# Patient Record
Sex: Male | Born: 1950 | Race: White | Hispanic: No | Marital: Single
Health system: Southern US, Community
[De-identification: ages and names within clinical notes are randomized; demographics above are authoritative.]

## PROBLEM LIST (undated history)

## (undated) DIAGNOSIS — R200 Anesthesia of skin: Secondary | ICD-10-CM

## (undated) DIAGNOSIS — T8859XA Other complications of anesthesia, initial encounter: Secondary | ICD-10-CM

---

## 2021-02-10 ENCOUNTER — Other Ambulatory Visit: Payer: Self-pay | Admitting: Urology

## 2021-02-10 DIAGNOSIS — R972 Elevated prostate specific antigen [PSA]: Secondary | ICD-10-CM

## 2021-02-25 ENCOUNTER — Ambulatory Visit
Admission: RE | Admit: 2021-02-25 | Discharge: 2021-02-25 | Disposition: A | Discharge status: Home / Self Care | Payer: Self-pay | Source: Ambulatory Visit | Attending: Urology | Admitting: Urology

## 2021-02-25 DIAGNOSIS — R972 Elevated prostate specific antigen [PSA]: Secondary | ICD-10-CM

## 2021-02-25 MED ORDER — GADOBENATE DIMEGLUMINE 529 MG/ML IV SOLN
20.0000 mL | Freq: Once | INTRAVENOUS | Status: AC | PRN
  Administered 2021-02-25: 20 mL via INTRAVENOUS

## 2021-03-02 ENCOUNTER — Other Ambulatory Visit: Payer: Self-pay | Admitting: Urology

## 2021-03-02 DIAGNOSIS — D483 Neoplasm of uncertain behavior of retroperitoneum: Secondary | ICD-10-CM

## 2021-03-27 ENCOUNTER — Ambulatory Visit
Admission: RE | Admit: 2021-03-27 | Discharge: 2021-03-27 | Disposition: A | Discharge status: Home / Self Care | Payer: Medicare Other | Source: Ambulatory Visit | Attending: Urology | Admitting: Urology

## 2021-03-27 DIAGNOSIS — D483 Neoplasm of uncertain behavior of retroperitoneum: Secondary | ICD-10-CM

## 2021-03-27 MED ORDER — GADOBENATE DIMEGLUMINE 529 MG/ML IV SOLN
20.0000 mL | Freq: Once | INTRAVENOUS | Status: AC | PRN
  Administered 2021-03-27: 20 mL via INTRAVENOUS

## 2021-11-03 ENCOUNTER — Other Ambulatory Visit: Payer: Self-pay | Admitting: Urology

## 2021-11-03 ENCOUNTER — Other Ambulatory Visit (HOSPITAL_COMMUNITY): Payer: Self-pay | Admitting: Urology

## 2021-11-03 DIAGNOSIS — R1903 Right lower quadrant abdominal swelling, mass and lump: Secondary | ICD-10-CM

## 2021-11-04 ENCOUNTER — Encounter: Payer: Self-pay | Admitting: *Deleted

## 2021-11-04 NOTE — Progress Notes (Unsigned)
Javier Edouard, MD  [name] OK for CT guided bx of nodule abutting right psoas muscle by CT.   GY

## 2021-11-30 ENCOUNTER — Other Ambulatory Visit: Payer: Self-pay | Admitting: Radiology

## 2021-11-30 DIAGNOSIS — R1900 Intra-abdominal and pelvic swelling, mass and lump, unspecified site: Secondary | ICD-10-CM

## 2021-12-01 ENCOUNTER — Ambulatory Visit (HOSPITAL_COMMUNITY)
Admission: RE | Admit: 2021-12-01 | Discharge: 2021-12-01 | Disposition: A | Discharge status: Home / Self Care | Payer: Medicare Other | Source: Ambulatory Visit | Attending: Urology | Admitting: Urology

## 2021-12-01 ENCOUNTER — Other Ambulatory Visit: Payer: Self-pay

## 2021-12-01 ENCOUNTER — Encounter (HOSPITAL_COMMUNITY): Payer: Self-pay

## 2021-12-01 DIAGNOSIS — C779 Secondary and unspecified malignant neoplasm of lymph node, unspecified: Secondary | ICD-10-CM | POA: Insufficient documentation

## 2021-12-01 DIAGNOSIS — K760 Fatty (change of) liver, not elsewhere classified: Secondary | ICD-10-CM | POA: Diagnosis not present

## 2021-12-01 DIAGNOSIS — C61 Malignant neoplasm of prostate: Secondary | ICD-10-CM | POA: Diagnosis not present

## 2021-12-01 DIAGNOSIS — R1903 Right lower quadrant abdominal swelling, mass and lump: Secondary | ICD-10-CM

## 2021-12-01 DIAGNOSIS — N400 Enlarged prostate without lower urinary tract symptoms: Secondary | ICD-10-CM | POA: Insufficient documentation

## 2021-12-01 DIAGNOSIS — Z8546 Personal history of malignant neoplasm of prostate: Secondary | ICD-10-CM | POA: Insufficient documentation

## 2021-12-01 DIAGNOSIS — R1900 Intra-abdominal and pelvic swelling, mass and lump, unspecified site: Secondary | ICD-10-CM

## 2021-12-01 HISTORY — DX: Other complications of anesthesia, initial encounter: T88.59XA

## 2021-12-01 HISTORY — DX: Anesthesia of skin: R20.0

## 2021-12-01 LAB — CBC WITH DIFFERENTIAL/PLATELET
Abs Immature Granulocytes: 0.04 10*3/uL (ref 0.00–0.07)
Basophils Absolute: 0.1 10*3/uL (ref 0.0–0.1)
Basophils Relative: 1 %
Eosinophils Absolute: 0.1 10*3/uL (ref 0.0–0.5)
Eosinophils Relative: 1 %
HCT: 49.7 % (ref 39.0–52.0)
Hemoglobin: 16.7 g/dL (ref 13.0–17.0)
Immature Granulocytes: 1 %
Lymphocytes Relative: 44 %
Lymphs Abs: 3.8 10*3/uL (ref 0.7–4.0)
MCH: 30.3 pg (ref 26.0–34.0)
MCHC: 33.6 g/dL (ref 30.0–36.0)
MCV: 90.2 fL (ref 80.0–100.0)
Monocytes Absolute: 0.8 10*3/uL (ref 0.1–1.0)
Monocytes Relative: 9 %
Neutro Abs: 3.8 10*3/uL (ref 1.7–7.7)
Neutrophils Relative %: 44 %
Platelets: 230 10*3/uL (ref 150–400)
RBC: 5.51 MIL/uL (ref 4.22–5.81)
RDW: 13 % (ref 11.5–15.5)
WBC: 8.6 10*3/uL (ref 4.0–10.5)
nRBC: 0 % (ref 0.0–0.2)

## 2021-12-01 LAB — BASIC METABOLIC PANEL
Anion gap: 8 (ref 5–15)
BUN: 15 mg/dL (ref 8–23)
CO2: 23 mmol/L (ref 22–32)
Calcium: 8.9 mg/dL (ref 8.9–10.3)
Chloride: 108 mmol/L (ref 98–111)
Creatinine, Ser: 1.02 mg/dL (ref 0.61–1.24)
GFR, Estimated: >60 mL/min (ref >60)
Glucose, Bld: 117 mg/dL — ABNORMAL HIGH (ref 70–99)
Potassium: 4.1 mmol/L (ref 3.5–5.1)
Sodium: 139 mmol/L (ref 135–145)

## 2021-12-01 LAB — PROTIME-INR
INR: 1.1 (ref 0.8–1.2)
Prothrombin Time: 13.7 seconds (ref 11.4–15.2)

## 2021-12-01 MED ORDER — FENTANYL CITRATE (PF) 100 MCG/2ML IJ SOLN
INTRAMUSCULAR | Status: AC | PRN
  Administered 2021-12-01 (×2): 50 ug via INTRAVENOUS

## 2021-12-01 MED ORDER — MIDAZOLAM HCL 2 MG/2ML IJ SOLN
INTRAMUSCULAR | Status: AC | PRN
  Administered 2021-12-01 (×2): 1 mg via INTRAVENOUS

## 2021-12-01 MED ORDER — MIDAZOLAM HCL 2 MG/2ML IJ SOLN
INTRAMUSCULAR | Status: AC
  Filled 2021-12-01: qty 4

## 2021-12-01 MED ORDER — FENTANYL CITRATE (PF) 100 MCG/2ML IJ SOLN
INTRAMUSCULAR | Status: AC
  Filled 2021-12-01: qty 2

## 2021-12-01 MED ORDER — SODIUM CHLORIDE 0.9 % IV SOLN: INTRAVENOUS | Status: DC

## 2021-12-01 NOTE — Procedures (Signed)
Interventional Radiology Procedure Note  Procedure: CT bx of right retroperitoneal soft tissue mass  Complications: None  Estimated Blood Loss: None  Recommendations: - DC home   Signed,  Criselda Peaches, MD

## 2021-12-01 NOTE — Discharge Instructions (Signed)
Please call Interventional Radiology clinic [phone] with any questions or concerns.   You may remove your dressing and shower tomorrow.    Needle Biopsy, Care After These instructions tell you how to care for yourself after your procedure. Your doctor may also give you more specific instructions. Call your doctor if youhave any problems or questions. What can I expect after the procedure? After the procedure, it is common to have: Soreness. Bruising. Mild pain. Follow these instructions at home:  Return to your normal activities as told by your doctor. Ask your doctor what activities are safe for you. Take over-the-counter and prescription medicines only as told by your doctor. Wash your hands with soap and water before you change your bandage (dressing). If you cannot use soap and water, use hand sanitizer. Follow instructions from your doctor about: How to take care of your puncture site. When and how to change your bandage. When to remove your bandage. Check your puncture site every day for signs of infection. Watch for: Redness, swelling, or pain. Fluid or blood. Pus or a bad smell. Warmth. Do not take baths, swim, or use a hot tub until your doctor approves. Ask your doctor if you may take showers. You may only be allowed to take sponge baths. Keep all follow-up visits as told by your doctor. This is important. Contact a doctor if you have: A fever. Redness, swelling, or pain at the puncture site, and it lasts longer than a few days. Fluid, blood, or pus coming from the puncture site. Warmth coming from the puncture site. Get help right away if: You have a lot of bleeding from the puncture site. Summary After the procedure, it is common to have soreness, bruising, or mild pain at the puncture site. Check your puncture site every day for signs of infection, such as redness, swelling, or pain. Get help right away if you have severe bleeding from your puncture  site. This information is not intended to replace advice given to you by your health care provider. Make sure you discuss any questions you have with your healthcare provider. Document Revised: 10/09/2019 Document Reviewed: 10/09/2019 Elsevier Patient Education  2022 Else[employer] Inc[employer]   Moderate Conscious Sedation, Adult, Care After This sheet gives you information about how to care for yourself after your procedure. Your health care provider may also give you more specific instructions. If you have problems or questions, contact your health care provider. What can I expect after the procedure? After the procedure, it is common to have: Sleepiness for several hours. Impaired judgment for several hours. Difficulty with balance. Vomiting if you eat too soon. Follow these instructions at home: For the time period you were told by your health care provider: Rest. Do not participate in activities where you could fall or become injured. Do not drive or use machinery. Do not drink alcohol. Do not take sleeping pills or medicines that cause drowsiness. Do not make important decisions or sign legal documents. Do not take care of children on your own.      Eating and drinking Follow the diet recommended by your health care provider. Drink enough fluid to keep your urine pale yellow. If you vomit: Drink water, juice, or soup when you can drink without vomiting. Make sure you have little or no nausea before eating solid foods.   General instructions Take over-the-counter and prescription medicines only as told by your health care provider. Have a responsible adult stay with you for the time you are told.   It is important to have someone help care for you until you are awake and alert. Do not smoke. Keep all follow-up visits as told by your health care provider. This is important. Contact a health care provider if: You are still sleepy or having trouble with balance after 24 hours. You feel  light-headed. You keep feeling nauseous or you keep vomiting. You develop a rash. You have a fever. You have redness or swelling around the IV site. Get help right away if: You have trouble breathing. You have new-onset confusion at home. Summary After the procedure, it is common to feel sleepy, have impaired judgment, or feel nauseous if you eat too soon. Rest after you get home. Know the things you should not do after the procedure. Follow the diet recommended by your health care provider and drink enough fluid to keep your urine pale yellow. Get help right away if you have trouble breathing or new-onset confusion at home. This information is not intended to replace advice given to you by your health care provider. Make sure you discuss any questions you have with your health care provider. Document Revised: 08/08/2019 Document Reviewed: 03/06/2019 Elsevier Patient Education  2021 Elsevier Inc.     

## 2021-12-01 NOTE — Consult Note (Signed)
Chief Complaint: Patient was seen in consultation today for image guided biopsy of nodule abutting right psoas muscle  Referring Physician(s): [facility]  Supervising Physician: Jacqulynn Cadet  Patient Status: [facility] - Out-pt  History of Present Illness: Daryon Remmert is a 71 y.o. male with past medical history of BPH/LUTS and elevated PSA.  Patient has a past history of a right retroperitoneal lesion near the right psoas muscle with latest MRI on 03/28/2021 revealing:   1. There is a redemonstrated intrinsically T2 hyperintense, T1 hypointense lesion in the right iliopsoas groove measuring 2.2 x 1.8 cm demonstrating serpiginous peripheral contrast enhancement. This remains of uncertain nature, and as discussed on prior examination, general differential considerations include a necrotic lymph node, peripheral nerve tumor, or lymphovascular structure. Metastatic lymphadenopathy or soft tissue metastasis is a general differential consideration, but this would be an unusual, isolated manifestation of metastatic prostate malignancy, particularly in the absence of other evidence of lymphadenopathy or metastatic disease in the abdomen or pelvis. Recommend follow-up surveillance imaging in 3-6 months to ensure stability. This could further be characterized for metabolic activity by PET-CT and is likely amenable to percutaneous tissue sampling given location. 2. Severe, nodular prostatomegaly, better characterized by prior dedicated MR of the prostate. 3. No acute findings in the abdomen or pelvis  CT at urology office in April of this year revealed:   1. Enhancing thick-walled nodule adjacent to the right psoas muscle, with slight increase in size from 03/27/2021, findings worrisome for malignancy. 2. Marked prostate enlargement. 3. Mild basilar subpleural pulmonary parenchymal ground-glass may be indicative of interstitial lung disease. If further evaluation is desired,  high resolution chest CT without contrast is recommended. 4. Hepatic steatosis.   He presents today for image guided biopsy of the nodule abutting the right psoas muscle.          Allergies: Patient has no allergy information on record.  Medications: Prior to Admission medications   Not on File     No family history on file.  Social History   Socioeconomic History   Marital status: Single    Spouse name: Not on file   Number of children: Not on file   Years of education: Not on file   Highest education level: Not on file  Occupational History   Not on file  Tobacco Use   Smoking status: Not on file   Smokeless tobacco: Not on file  Substance and Sexual Activity   Alcohol use: Not on file   Drug use: Not on file   Sexual activity: Not on file  Other Topics Concern   Not on file  Social History Narrative   Not on file   Social Determinants of Health   Financial Resource Strain: Not on file  Food Insecurity: Not on file  Transportation Needs: Not on file  Physical Activity: Not on file  Stress: Not on file  Social Connections: Not on file      Review of Systems he currently denies fever,  chest pain, dyspnea, cough, abdominal pain, back pain, nausea, vomiting or bleeding.  He does have a mild headache.  Vital Signs: Blood pressure 151/99, temp 98.6, heart rate 78, respiration 18, O2 sat 97% room air       Physical Exam awake, alert.  Chest clear to auscultation bilaterally.  Heart with regular rate and rhythm.  Abdomen soft, positive bowel sounds, nontender.  No lower extremity edema       Assessment and Plan: 71 y.o. male with past  medical history of BPH/LUTS and elevated PSA.  Patient has a past history of a right retroperitoneal lesion near the right psoas muscle with latest MRI on 03/28/2021 revealing:   1. There is a redemonstrated intrinsically T2 hyperintense, T1 hypointense lesion in the right iliopsoas groove measuring 2.2 x 1.8 cm  demonstrating serpiginous peripheral contrast enhancement. This remains of uncertain nature, and as discussed on prior examination, general differential considerations include a necrotic lymph node, peripheral nerve tumor, or lymphovascular structure. Metastatic lymphadenopathy or soft tissue metastasis is a general differential consideration, but this would be an unusual, isolated manifestation of metastatic prostate malignancy, particularly in the absence of other evidence of lymphadenopathy or metastatic disease in the abdomen or pelvis. Recommend follow-up surveillance imaging in 3-6 months to ensure stability. This could further be characterized for metabolic activity by PET-CT and is likely amenable to percutaneous tissue sampling given location. 2. Severe, nodular prostatomegaly, better characterized by prior dedicated MR of the prostate. 3. No acute findings in the abdomen or pelvis  He presents today for image guided biopsy of the nodule abutting the right psoas muscle.  LABS PENDING   Thank you for this interesting consult.  I greatly enjoyed meeting Saatvik Hopes and look forward to participating in their care.  A copy of this report was sent to the requesting provider on this date.  Electronically Signed: D. Rowe Robert, PA-C 12/01/2021, 9:07 AM   I spent a total of  20 minutes   in face to face in clinical consultation, greater than 50% of which was counseling/coordinating care for image guided biopsy of nodule abutting right psoas muscle

## 2021-12-05 LAB — SURGICAL PATHOLOGY

## 2022-06-26 IMAGING — MR MR ABDOMEN WO/W CM
8 of 15 series · 19 of 48 positions shown · IV contrast (multihance)
Comparison: MR prostate, 02/25/2021

CLINICAL DATA: Characterize right retroperitoneal lesion identified
by prior MR prostate

EXAM:
MRI ABDOMEN AND PELVIS WITHOUT AND WITH CONTRAST
TECHNIQUE: Multiplanar multisequence MR imaging of the abdomen and pelvis was
performed both before and after the administration of intravenous
contrast.
CONTRAST:  20mL MULTIHANCE GADOBENATE DIMEGLUMINE 529 MG/ML IV SOLN

[Series 3: T2 · coronal · 6.5mm · 1.64mm/px · 1 of 31 slices shown (1 of 2)]
[im 1/31]
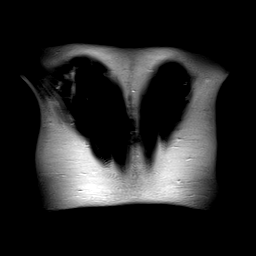

[Series 4: axial tru fisp=abdomen · axial · 4.5mm · 1.76mm/px · 1 of 44 slices shown]
[im 1/44]
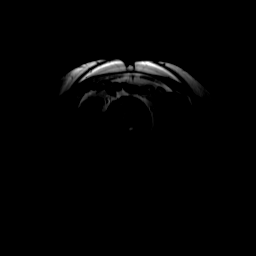

[Series 5: axial in out · axial · 6.0mm · 0.88mm/px · z∈[-1,+244]mm · 2 of 72 slices shown]
[im 1/72]
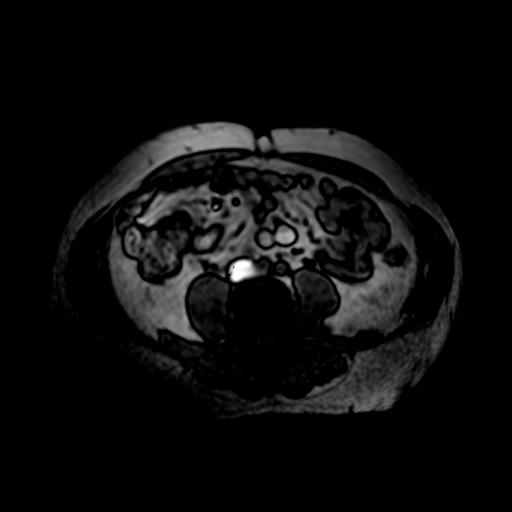
[im 72/72]
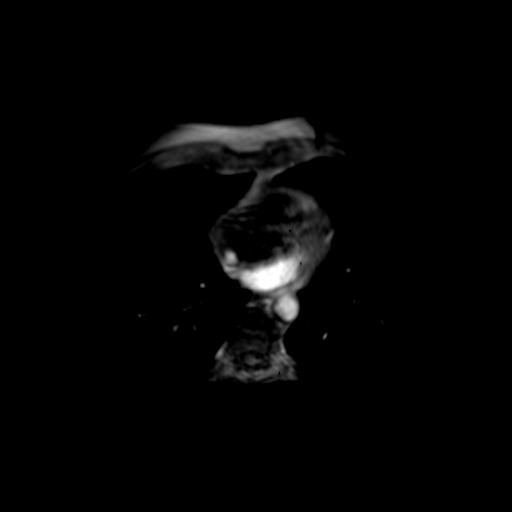

[Series 6: T2 · axial · 4.5mm · 0.88mm/px · 1 of 44 slices shown (2 of 2)]
[im 1/44]
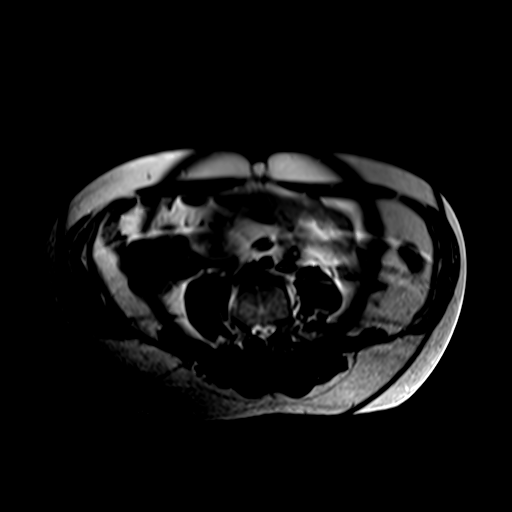

[Series 7: T1 dynamic · axial · non-contrast · 3.0mm · 0.88mm/px · z∈[+3,+240]mm · 3 of 80 slices shown]
[im 1/80]
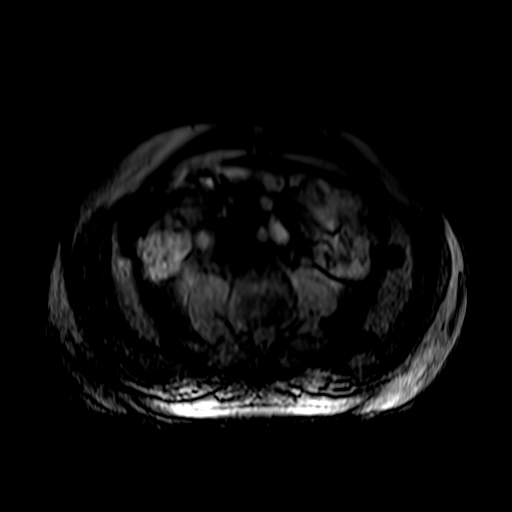
[im 40/80]
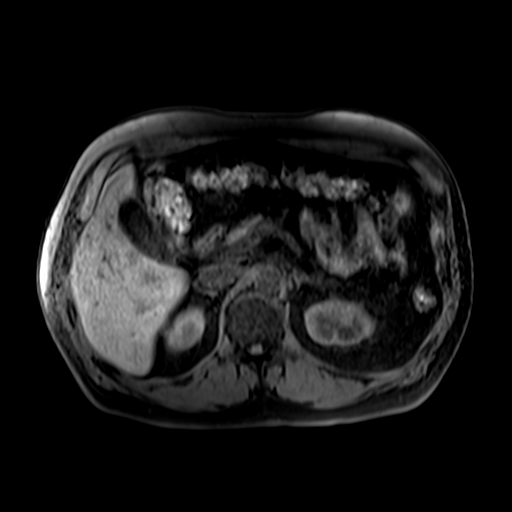
[im 80/80]
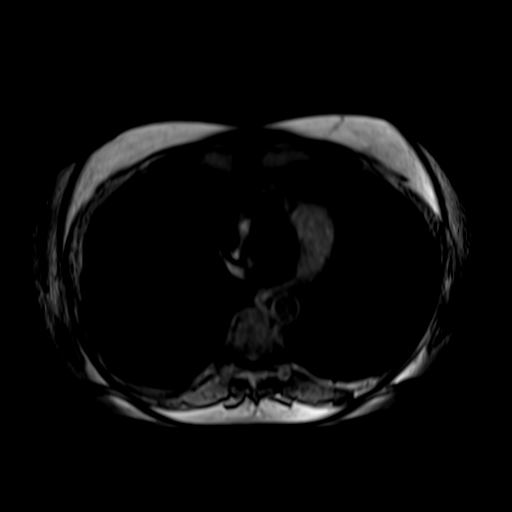

[Series 8: post 20 sec=abdomen · axial · 3.0mm · 0.88mm/px · z∈[+3,+240]mm · 4 of 80 slices shown]
[im 1/80]
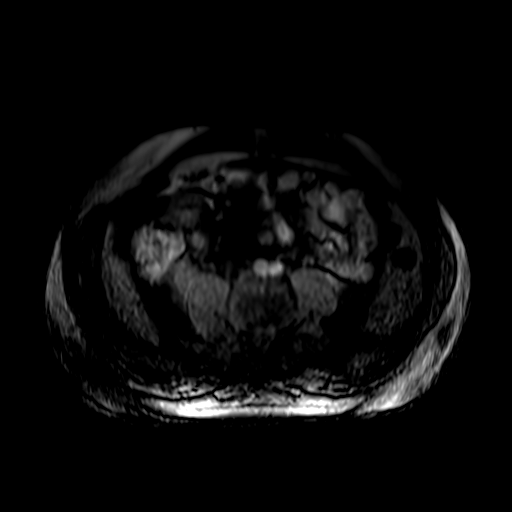
[im 27/80]
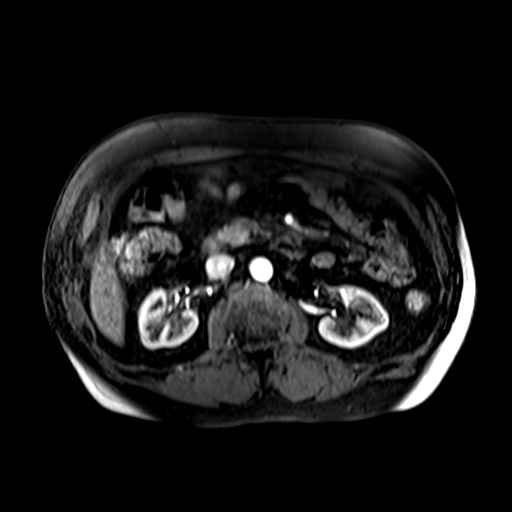
[im 53/80]
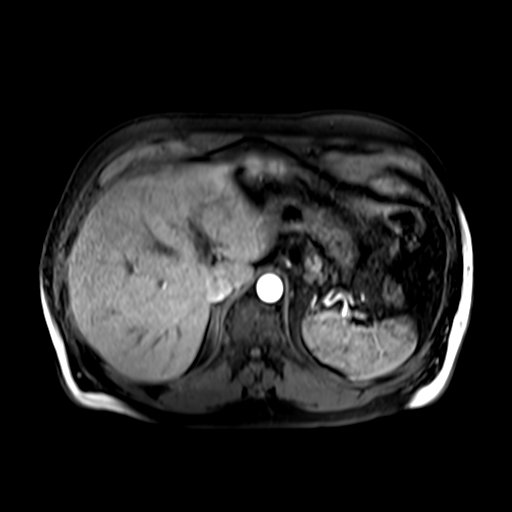
[im 80/80]
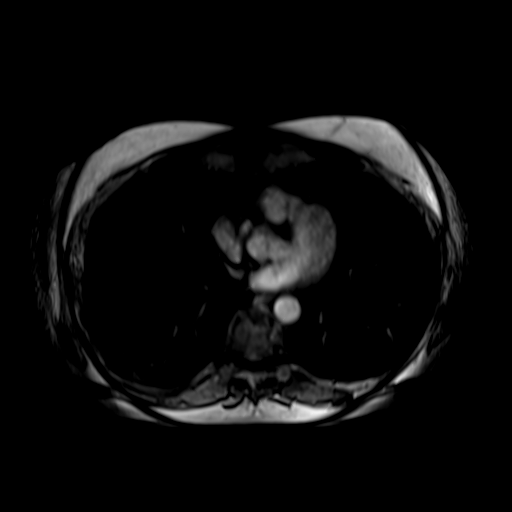

[Series 9: post 20 sec=abdomen_sub · axial · 3.0mm · 0.88mm/px · z∈[+3,+240]mm · 4 of 80 slices shown]
[im 1/80]
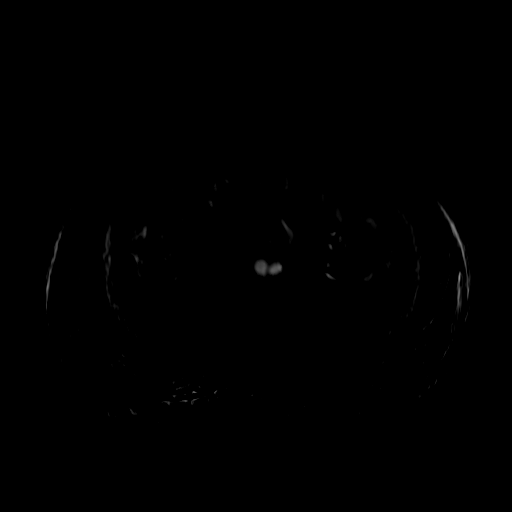
[im 27/80]
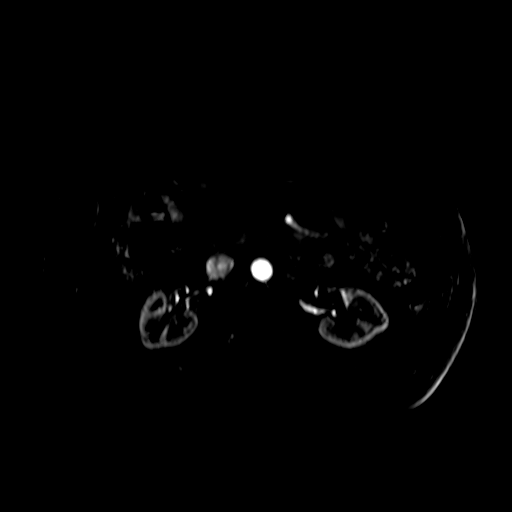
[im 53/80]
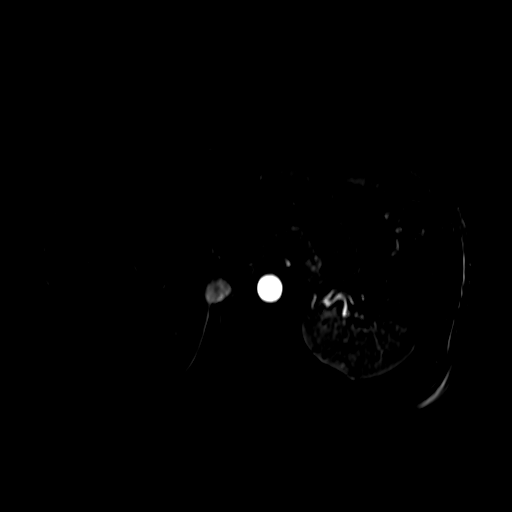
[im 80/80]
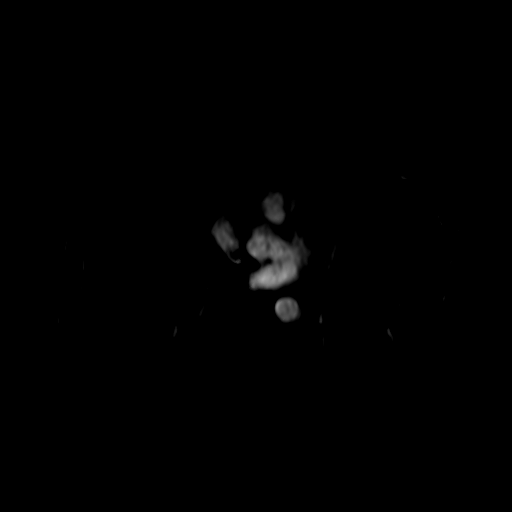

[Series 10: post 45 sec=abdomen · axial · 3.0mm · 0.88mm/px · z∈[+3,+159]mm · 3 of 80 slices shown]
[im 1/80]
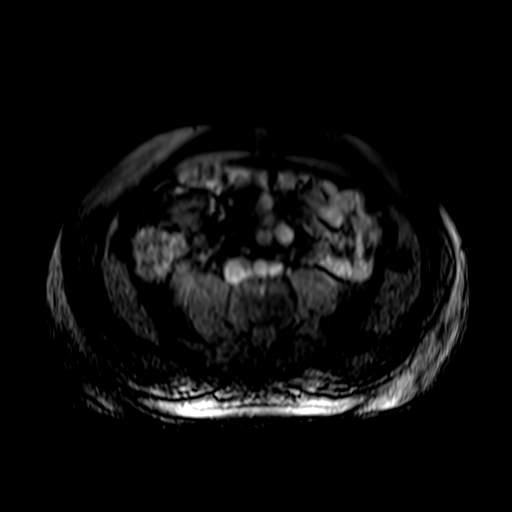
[im 27/80]
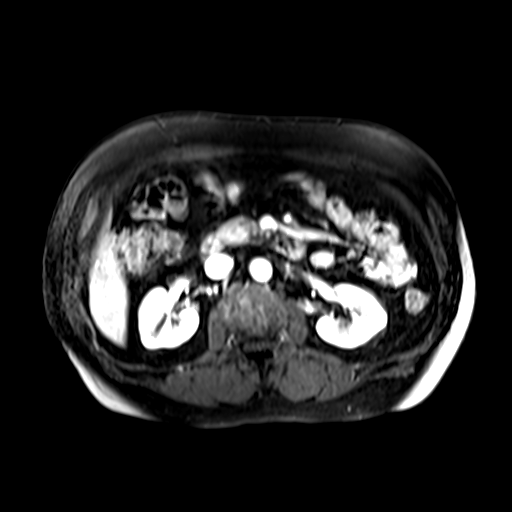
[im 53/80]
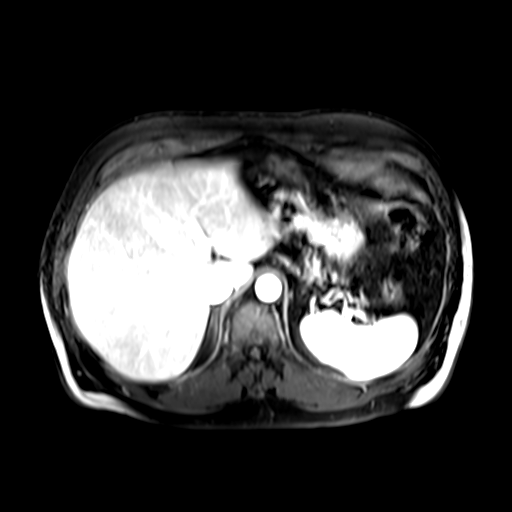

[19 of 48 positions shown; findings below may reference images not displayed]

FINDINGS: COMBINED FINDINGS FOR BOTH MR ABDOMEN AND PELVIS

Lower chest: No acute findings.

Hepatobiliary: No mass or other parenchymal abnormality identified.
No gallstones. No biliary ductal dilatation.

Pancreas: No mass, inflammatory changes, or other parenchymal
abnormality identified. No pancreatic ductal dilatation.

Spleen:  Within normal limits in size and appearance.

Adrenals/Urinary Tract: No masses identified. No evidence of
hydronephrosis.

Stomach/Bowel: Visualized portions within the abdomen are
unremarkable.

Vascular/Lymphatic: No definite pathologically enlarged lymph nodes
identified. No abdominal aortic aneurysm demonstrated.

Reproductive: Severe, nodular prostatomegaly, better characterized
by prior dedicated MR of the prostate.

Other: There is a redemonstrated intrinsically T2 hyperintense, T1
hypointense lesion in the right iliopsoas groove measuring 2.2 x
cm demonstrating serpiginous peripheral contrast enhancement (series
9, image 8).

Musculoskeletal: No suspicious bone lesions identified.
IMPRESSION: 1. There is a redemonstrated intrinsically T2 hyperintense, T1
hypointense lesion in the right iliopsoas groove measuring 2.2 x
cm demonstrating serpiginous peripheral contrast enhancement. This
remains of uncertain nature, and as discussed on prior examination,
general differential considerations include a necrotic lymph node,
peripheral nerve tumor, or lymphovascular structure. Metastatic
lymphadenopathy or soft tissue metastasis is a general differential
consideration, but this would be an unusual, isolated manifestation
of metastatic prostate malignancy, particularly in the absence of
other evidence of lymphadenopathy or metastatic disease in the
abdomen or pelvis. Recommend follow-up surveillance imaging in 3-6
months to ensure stability. This could further be characterized for
metabolic activity by PET-CT and is likely amenable to percutaneous
tissue sampling given location.
2. Severe, nodular prostatomegaly, better characterized by prior
dedicated MR of the prostate.
3. No acute findings in the abdomen or pelvis.

## 2023-02-09 ENCOUNTER — Ambulatory Visit (INDEPENDENT_AMBULATORY_CARE_PROVIDER_SITE_OTHER): Payer: Medicare Other | Admitting: Otolaryngology

## 2023-02-09 VITALS — Ht 72.0 in | Wt 216.0 lb

## 2023-02-09 DIAGNOSIS — H9201 Otalgia, right ear: Secondary | ICD-10-CM | POA: Insufficient documentation

## 2023-02-09 NOTE — Progress Notes (Signed)
Patient ID: Javier Keller, male   DOB: 1950/12/27, 72 y.o.   MRN: [id number]  Follow-up: Right ear pain  HPI: The patient is a 72 year old male who returns today for his follow-up evaluation. He was last seen 1 month ago.  At that time, he was complaining of recurrent right ear pain.  His ear canals, tympanic membranes, and middle ear spaces were normal.  He was diagnosed with referred right otalgia, likely secondary to musculoskeletal causes.  He was treated with Robaxin and NSAIDs as needed.  The patient returns today reporting significant improvement in his right ear pain.  He has had only 1 episode of right ear discomfort over the past month.  He denies any change in his hearing.  He denies any dysphagia, odynophagia, or sore throat.  Exam: General: Communicates without difficulty, well nourished, no acute distress. Head: Normocephalic, no evidence injury, no tenderness, facial buttresses intact without stepoff. Face/sinus: No tenderness to palpation and percussion. Facial movement is normal and symmetric. Eyes: PERRL, EOMI. No scleral icterus, conjunctivae clear. Neuro: CN II exam reveals vision grossly intact.  No nystagmus at any point of gaze. Ears: Auricles well formed without lesions.  Ear canals are intact without mass or lesion.  No erythema or edema is appreciated.  The TMs are intact without fluid. Nose: External evaluation reveals normal support and skin without lesions.  Dorsum is intact.  Anterior rhinoscopy reveals congested mucosa over anterior aspect of inferior turbinates and intact septum.  No purulence noted. Oral:  Oral cavity and oropharynx are intact, symmetric, without erythema or edema.  Mucosa is moist without lesions. Neck: Full range of motion without pain.  There is no significant lymphadenopathy.  No masses palpable.  Thyroid bed within normal limits to palpation.  Parotid glands and submandibular glands equal bilaterally without mass.  Trachea is midline. Neuro:  CN 2-12  grossly intact.    Assessment: 1.  Referred right otalgia, likely secondary to musculoskeletal causes.  His otalgia has significantly improved. 2.  His ear canals, tympanic membranes, and middle ear spaces are normal. 3.  The rest of his ENT exam is also unremarkable.  Plan: 1.  The physical exam findings are reviewed with the patient. 2.  Continue with Robaxin/NSAIDs as needed. 3.  The patient will return for reevaluation if his symptoms worsen.
